# Patient Record
Sex: Male | Born: 1950 | Race: White | Hispanic: No | Marital: Married | Smoking: Former smoker
Health system: Southern US, Community
[De-identification: ages and names within clinical notes are randomized; demographics above are authoritative.]

## PROBLEM LIST (undated history)

## (undated) DIAGNOSIS — M549 Dorsalgia, unspecified: Secondary | ICD-10-CM

## (undated) DIAGNOSIS — A879 Viral meningitis, unspecified: Secondary | ICD-10-CM

## (undated) HISTORY — PX: HERNIA REPAIR: SHX51

## (undated) HISTORY — PX: TONSILLECTOMY: SUR1361

## (undated) HISTORY — PX: BACK SURGERY: SHX140

---

## 2007-03-08 ENCOUNTER — Ambulatory Visit: Payer: Self-pay | Admitting: Gastroenterology

## 2007-07-17 ENCOUNTER — Ambulatory Visit: Payer: Self-pay | Admitting: Internal Medicine

## 2008-10-06 ENCOUNTER — Ambulatory Visit: Payer: Self-pay | Admitting: Family Medicine

## 2008-10-19 ENCOUNTER — Ambulatory Visit: Payer: Self-pay | Admitting: Family Medicine

## 2011-08-28 ENCOUNTER — Ambulatory Visit: Payer: Self-pay
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
## 2011-09-13 ENCOUNTER — Ambulatory Visit: Payer: Self-pay | Admitting: Internal Medicine

## 2014-05-05 DIAGNOSIS — J302 Other seasonal allergic rhinitis: Secondary | ICD-10-CM | POA: Insufficient documentation

## 2015-04-23 ENCOUNTER — Ambulatory Visit
Admission: EM | Admit: 2015-04-23 | Discharge: 2015-04-23 | Disposition: A | Discharge status: Home / Self Care | Payer: Medicare Other | Attending: Emergency Medicine | Admitting: Emergency Medicine

## 2015-04-23 ENCOUNTER — Ambulatory Visit: Payer: Medicare Other

## 2015-04-23 ENCOUNTER — Encounter: Payer: Self-pay | Admitting: Emergency Medicine

## 2015-04-23 DIAGNOSIS — M5030 Other cervical disc degeneration, unspecified cervical region: Secondary | ICD-10-CM | POA: Diagnosis not present

## 2015-04-23 DIAGNOSIS — M5431 Sciatica, right side: Secondary | ICD-10-CM

## 2015-04-23 DIAGNOSIS — M25512 Pain in left shoulder: Secondary | ICD-10-CM | POA: Insufficient documentation

## 2015-04-23 DIAGNOSIS — M5137 Other intervertebral disc degeneration, lumbosacral region: Secondary | ICD-10-CM | POA: Diagnosis not present

## 2015-04-23 DIAGNOSIS — M542 Cervicalgia: Secondary | ICD-10-CM | POA: Diagnosis present

## 2015-04-23 HISTORY — DX: Viral meningitis, unspecified: A87.9

## 2015-04-23 HISTORY — DX: Dorsalgia, unspecified: M54.9

## 2015-04-23 MED ORDER — METHYLPREDNISOLONE SODIUM SUCC 125 MG IJ SOLR
125.0000 mg | Freq: Once | INTRAMUSCULAR | Status: AC
Start: 2015-04-23 — End: 2015-04-23
  Administered 2015-04-23: 125 mg via INTRAMUSCULAR

## 2015-04-23 MED ORDER — KETOROLAC TROMETHAMINE 60 MG/2ML IM SOLN
60.0000 mg | Freq: Once | INTRAMUSCULAR | Status: AC
  Administered 2015-04-23: 60 mg via INTRAMUSCULAR

## 2015-04-23 MED ORDER — PREDNISONE 10 MG (21) PO TBPK: 10.0000 mg | ORAL_TABLET | Freq: Every day | ORAL | Status: AC

## 2015-04-23 MED ORDER — CYCLOBENZAPRINE HCL 10 MG PO TABS: 10.0000 mg | ORAL_TABLET | Freq: Two times a day (BID) | ORAL | Status: AC | PRN

## 2015-04-23 MED ORDER — HYDROCODONE-ACETAMINOPHEN 5-325 MG PO TABS
1.0000 | ORAL_TABLET | ORAL | Status: AC | PRN
Start: 2015-04-23 — End: ?
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
## 2015-04-23 NOTE — ED Notes (Signed)
Also left shoulder pain.

## 2015-04-23 NOTE — ED Notes (Signed)
Pt reports Right hip pain, denies injury. Pain for about a week, tried exercises, hanging upside down on inverter, heat. Pain shoots down R leg.  Prior back surgery in 2006.

## 2015-04-23 NOTE — Discharge Instructions (Signed)
Your xrays show DDD of cervical and lumbar spine. Will treat conservatively with steroids, muscle relaxers, pain meds. [facility] regarding further management of DDD. Go to Er immediately if you develop loss of function, loss of bowel and bladder, or saddle numbness/ worsening issues.

## 2015-05-03 ENCOUNTER — Other Ambulatory Visit: Payer: Self-pay | Admitting: Orthopedic Surgery

## 2015-05-03 DIAGNOSIS — M5412 Radiculopathy, cervical region: Secondary | ICD-10-CM

## 2015-05-15 ENCOUNTER — Ambulatory Visit
Admission: RE | Admit: 2015-05-15 | Discharge: 2015-05-15 | Disposition: A | Discharge status: Home / Self Care | Payer: Medicare Other | Source: Ambulatory Visit | Attending: Orthopedic Surgery | Admitting: Orthopedic Surgery

## 2015-05-15 DIAGNOSIS — M5412 Radiculopathy, cervical region: Secondary | ICD-10-CM | POA: Insufficient documentation

## 2015-05-25 ENCOUNTER — Other Ambulatory Visit: Payer: Self-pay | Admitting: Orthopedic Surgery

## 2015-05-25 DIAGNOSIS — M5412 Radiculopathy, cervical region: Secondary | ICD-10-CM

## 2015-05-28 ENCOUNTER — Ambulatory Visit
Admission: RE | Admit: 2015-05-28 | Discharge: 2015-05-28 | Disposition: A | Discharge status: Home / Self Care | Payer: Medicare Other | Source: Ambulatory Visit | Attending: Orthopedic Surgery | Admitting: Orthopedic Surgery

## 2015-05-28 VITALS — BP 147/91 | HR 84

## 2015-05-28 DIAGNOSIS — M5412 Radiculopathy, cervical region: Secondary | ICD-10-CM

## 2015-05-28 DIAGNOSIS — M5030 Other cervical disc degeneration, unspecified cervical region: Secondary | ICD-10-CM

## 2015-05-28 MED ORDER — METHYLPREDNISOLONE ACETATE 40 MG/ML INJ SUSP (RADIOLOG: 120.0000 mg | Freq: Once | INTRAMUSCULAR | Status: DC

## 2015-05-28 MED ORDER — IOHEXOL 180 MG/ML  SOLN: 1.0000 mL | Freq: Once | INTRAMUSCULAR | Status: AC | PRN

## 2015-05-28 MED ORDER — TRIAMCINOLONE ACETONIDE 40 MG/ML IJ SUSP (RADIOLOGY)
60.0000 mg | Freq: Once | INTRAMUSCULAR | Status: AC
  Administered 2015-05-28: 60 mg via EPIDURAL

## 2015-05-28 MED ORDER — IOHEXOL 300 MG/ML  SOLN
1.0000 mL | Freq: Once | INTRAMUSCULAR | Status: AC | PRN
  Administered 2015-05-28: 1 mL via EPIDURAL

## 2015-05-28 NOTE — Discharge Instructions (Signed)

## 2015-06-05 ENCOUNTER — Telehealth: Payer: Self-pay | Admitting: Radiology
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
## 2015-08-03 ENCOUNTER — Other Ambulatory Visit: Payer: Self-pay | Admitting: Orthopedic Surgery

## 2015-08-03 DIAGNOSIS — M5412 Radiculopathy, cervical region: Secondary | ICD-10-CM

## 2015-08-06 ENCOUNTER — Encounter: Payer: Self-pay | Admitting: Radiology

## 2015-08-06 ENCOUNTER — Ambulatory Visit
Admission: RE | Admit: 2015-08-06 | Discharge: 2015-08-06 | Disposition: A | Discharge status: Home / Self Care | Payer: Medicare Other | Source: Ambulatory Visit | Attending: Orthopedic Surgery | Admitting: Orthopedic Surgery

## 2015-08-06 ENCOUNTER — Other Ambulatory Visit: Payer: Self-pay | Admitting: Radiology

## 2015-08-06 DIAGNOSIS — K219 Gastro-esophageal reflux disease without esophagitis: Secondary | ICD-10-CM | POA: Insufficient documentation

## 2015-08-06 DIAGNOSIS — M542 Cervicalgia: Secondary | ICD-10-CM | POA: Insufficient documentation

## 2015-08-06 DIAGNOSIS — G5600 Carpal tunnel syndrome, unspecified upper limb: Secondary | ICD-10-CM | POA: Insufficient documentation

## 2015-08-06 DIAGNOSIS — E785 Hyperlipidemia, unspecified: Secondary | ICD-10-CM | POA: Insufficient documentation

## 2015-08-06 DIAGNOSIS — I10 Essential (primary) hypertension: Secondary | ICD-10-CM | POA: Insufficient documentation

## 2015-08-06 DIAGNOSIS — M5412 Radiculopathy, cervical region: Secondary | ICD-10-CM

## 2015-08-06 DIAGNOSIS — M5410 Radiculopathy, site unspecified: Secondary | ICD-10-CM | POA: Insufficient documentation

## 2015-08-06 DIAGNOSIS — M722 Plantar fascial fibromatosis: Secondary | ICD-10-CM | POA: Insufficient documentation

## 2015-08-06 MED ORDER — TRIAMCINOLONE ACETONIDE 40 MG/ML IJ SUSP (RADIOLOGY)
60.0000 mg | Freq: Once | INTRAMUSCULAR | Status: AC
  Administered 2015-08-06: 60 mg via EPIDURAL

## 2015-08-06 MED ORDER — IOHEXOL 300 MG/ML  SOLN
1.0000 mL | Freq: Once | INTRAMUSCULAR | Status: DC | PRN
  Administered 2015-08-06: 1 mL via EPIDURAL

## 2015-08-06 NOTE — Discharge Instructions (Signed)

## 2015-10-13 ENCOUNTER — Ambulatory Visit
Admission: EM | Admit: 2015-10-13 | Discharge: 2015-10-13 | Disposition: A | Discharge status: Home / Self Care | Payer: Medicare Other | Attending: Family Medicine | Admitting: Family Medicine

## 2015-10-13 ENCOUNTER — Encounter: Payer: Self-pay | Admitting: *Deleted

## 2015-10-13 ENCOUNTER — Ambulatory Visit: Payer: Medicare Other

## 2015-10-13 DIAGNOSIS — S9001XA Contusion of right ankle, initial encounter: Secondary | ICD-10-CM | POA: Diagnosis not present

## 2015-10-13 DIAGNOSIS — S9031XD Contusion of right foot, subsequent encounter: Secondary | ICD-10-CM | POA: Diagnosis not present

## 2015-10-13 DIAGNOSIS — X58XXXA Exposure to other specified factors, initial encounter: Secondary | ICD-10-CM | POA: Insufficient documentation

## 2015-10-13 DIAGNOSIS — M79604 Pain in right leg: Secondary | ICD-10-CM | POA: Diagnosis present

## 2015-10-13 MED ORDER — MELOXICAM 15 MG PO TABS: 15.0000 mg | ORAL_TABLET | Freq: Every day | ORAL | Status: AC

## 2015-10-13 NOTE — Discharge Instructions (Signed)
Contusion °A contusion is a deep bruise. Contusions happen when an injury causes bleeding under the skin. Symptoms of bruising include pain, swelling, and discolored skin. The skin may turn blue, purple, or yellow. °HOME CARE  °· Rest the injured area. °· If told, put ice on the injured area. °· Put ice in a plastic bag. °· Place a towel between your skin and the bag. °· Leave the ice on for 20 minutes, 2-3 times per day. °· If told, put light pressure (compression) on the injured area using an elastic bandage. Make sure the bandage is not too tight. Remove it and put it back on as told by your doctor. °· If possible, raise (elevate) the injured area above the level of your heart while you are sitting or lying down. °· Take over-the-counter and prescription medicines only as told by your doctor. °GET HELP IF: °· Your symptoms do not get better after several days of treatment. °· Your symptoms get worse. °· You have trouble moving the injured area. °GET HELP RIGHT AWAY IF:  °· You have very bad pain. °· You have a loss of feeling (numbness) in a hand or foot. °· Your hand or foot turns pale or cold. °  °This information is not intended to replace advice given to you by your health care provider. Make sure you discuss any questions you have with your health care provider. °  °Document Released: 05/19/2008 Document Revised: 08/22/2015 Document Reviewed: 04/18/2015 °Elsevier Interactive Patient Education ©2016 [employer] ° °Cryotherapy °Cryotherapy is when you put ice on your injury. Ice helps lessen pain and puffiness (swelling) after an injury. Ice works the best when you start using it in the first 24 to 48 hours after an injury. °HOME CARE °· Put a dry or damp towel between the ice pack and your skin. °· You may press gently on the ice pack. °· Leave the ice on for no more than 10 to 20 minutes at a time. °· Check your skin after 5 minutes to make sure your skin is okay. °· Rest at least 20 minutes between ice  pack uses. °· Stop using ice when your skin loses feeling (numbness). °· Do not use ice on someone who cannot tell you when it hurts. This includes small children and people with memory problems (dementia). °GET HELP RIGHT AWAY IF: °· You have white spots on your skin. °· Your skin turns blue or pale. °· Your skin feels waxy or hard. °· Your puffiness gets worse. °MAKE SURE YOU:  °· Understand these instructions. °· Will watch your condition. °· Will get help right away if you are not doing well or get worse. °  °This information is not intended to replace advice given to you by your health care provider. Make sure you discuss any questions you have with your health care provider. °  °Document Released: 05/19/2008 Document Revised: 02/23/2012 Document Reviewed: 07/24/2011 °Elsevier Interactive Patient Education ©2016 [employer]vier Inc. ° °
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
## 2015-12-06 ENCOUNTER — Other Ambulatory Visit: Payer: Self-pay | Admitting: Orthopedic Surgery

## 2015-12-06 DIAGNOSIS — M5412 Radiculopathy, cervical region: Secondary | ICD-10-CM

## 2015-12-06 DIAGNOSIS — M50223 Other cervical disc displacement at C6-C7 level: Secondary | ICD-10-CM

## 2016-01-01 ENCOUNTER — Ambulatory Visit
Admission: RE | Admit: 2016-01-01 | Discharge: 2016-01-01 | Disposition: A | Discharge status: Home / Self Care | Payer: Medicare Other | Source: Ambulatory Visit | Attending: Orthopedic Surgery | Admitting: Orthopedic Surgery

## 2016-01-01 DIAGNOSIS — M5412 Radiculopathy, cervical region: Secondary | ICD-10-CM

## 2016-01-01 DIAGNOSIS — M50223 Other cervical disc displacement at C6-C7 level: Secondary | ICD-10-CM

## 2016-01-01 MED ORDER — IOHEXOL 300 MG/ML  SOLN
1.0000 mL | Freq: Once | INTRAMUSCULAR | Status: AC | PRN
  Administered 2016-01-01: 1 mL via EPIDURAL

## 2016-01-01 MED ORDER — TRIAMCINOLONE ACETONIDE 40 MG/ML IJ SUSP (RADIOLOGY)
60.0000 mg | Freq: Once | INTRAMUSCULAR | Status: AC
  Administered 2016-01-01: 60 mg via EPIDURAL

## 2016-01-01 NOTE — Discharge Instructions (Signed)

## 2021-11-08 ENCOUNTER — Other Ambulatory Visit: Payer: Self-pay | Admitting: Family Medicine

## 2021-11-08 DIAGNOSIS — Z136 Encounter for screening for cardiovascular disorders: Secondary | ICD-10-CM

## 2021-11-08 DIAGNOSIS — Z8249 Family history of ischemic heart disease and other diseases of the circulatory system: Secondary | ICD-10-CM

## 2021-11-26 ENCOUNTER — Other Ambulatory Visit: Payer: Self-pay | Admitting: Family Medicine

## 2021-11-26 DIAGNOSIS — Z136 Encounter for screening for cardiovascular disorders: Secondary | ICD-10-CM

## 2021-11-26 DIAGNOSIS — I10 Essential (primary) hypertension: Secondary | ICD-10-CM

## 2024-02-17 ENCOUNTER — Ambulatory Visit: Payer: Self-pay

## 2024-02-17 DIAGNOSIS — K635 Polyp of colon: Secondary | ICD-10-CM | POA: Diagnosis not present

## 2024-02-17 DIAGNOSIS — D127 Benign neoplasm of rectosigmoid junction: Secondary | ICD-10-CM | POA: Diagnosis not present

## 2024-02-17 DIAGNOSIS — K640 First degree hemorrhoids: Secondary | ICD-10-CM | POA: Diagnosis not present

## 2024-02-17 DIAGNOSIS — Z860100 Personal history of colon polyps, unspecified: Secondary | ICD-10-CM | POA: Diagnosis not present

## 2024-02-17 DIAGNOSIS — K5730 Diverticulosis of large intestine without perforation or abscess without bleeding: Secondary | ICD-10-CM | POA: Diagnosis not present

## 2024-02-17 DIAGNOSIS — K621 Rectal polyp: Secondary | ICD-10-CM | POA: Diagnosis not present

## 2024-02-17 DIAGNOSIS — D123 Benign neoplasm of transverse colon: Secondary | ICD-10-CM | POA: Diagnosis not present

## 2024-02-17 DIAGNOSIS — Z1211 Encounter for screening for malignant neoplasm of colon: Secondary | ICD-10-CM | POA: Diagnosis present
